# Patient Record
Sex: Female | Born: 1955
Health system: Southern US, Community
[De-identification: ages and names within clinical notes are randomized; demographics above are authoritative.]

---

## 2007-04-06 ENCOUNTER — Other Ambulatory Visit: Admission: RE | Admit: 2007-04-06 | Discharge: 2007-04-06 | Payer: Self-pay | Admitting: Family Medicine

## 2008-04-11 ENCOUNTER — Other Ambulatory Visit: Admission: RE | Admit: 2008-04-11 | Discharge: 2008-04-11 | Payer: Self-pay | Admitting: Family Medicine

## 2015-11-11 DIAGNOSIS — I10 Essential (primary) hypertension: Secondary | ICD-10-CM | POA: Diagnosis not present

## 2015-11-11 DIAGNOSIS — G43909 Migraine, unspecified, not intractable, without status migrainosus: Secondary | ICD-10-CM | POA: Diagnosis not present

## 2015-12-18 DIAGNOSIS — H1033 Unspecified acute conjunctivitis, bilateral: Secondary | ICD-10-CM | POA: Diagnosis not present

## 2016-01-06 DIAGNOSIS — H1033 Unspecified acute conjunctivitis, bilateral: Secondary | ICD-10-CM | POA: Diagnosis not present

## 2016-01-26 DIAGNOSIS — H1045 Other chronic allergic conjunctivitis: Secondary | ICD-10-CM | POA: Diagnosis not present

## 2016-05-03 DIAGNOSIS — D485 Neoplasm of uncertain behavior of skin: Secondary | ICD-10-CM | POA: Diagnosis not present

## 2016-05-03 DIAGNOSIS — L309 Dermatitis, unspecified: Secondary | ICD-10-CM | POA: Diagnosis not present

## 2016-05-03 DIAGNOSIS — Z23 Encounter for immunization: Secondary | ICD-10-CM | POA: Diagnosis not present

## 2016-05-03 DIAGNOSIS — L820 Inflamed seborrheic keratosis: Secondary | ICD-10-CM | POA: Diagnosis not present

## 2016-08-10 DIAGNOSIS — Z803 Family history of malignant neoplasm of breast: Secondary | ICD-10-CM | POA: Diagnosis not present

## 2016-08-10 DIAGNOSIS — Z1231 Encounter for screening mammogram for malignant neoplasm of breast: Secondary | ICD-10-CM | POA: Diagnosis not present

## 2016-11-01 DIAGNOSIS — Z01419 Encounter for gynecological examination (general) (routine) without abnormal findings: Secondary | ICD-10-CM | POA: Diagnosis not present

## 2016-11-01 DIAGNOSIS — Z6827 Body mass index (BMI) 27.0-27.9, adult: Secondary | ICD-10-CM | POA: Diagnosis not present

## 2016-11-01 DIAGNOSIS — Z1151 Encounter for screening for human papillomavirus (HPV): Secondary | ICD-10-CM | POA: Diagnosis not present

## 2016-11-16 DIAGNOSIS — I10 Essential (primary) hypertension: Secondary | ICD-10-CM | POA: Diagnosis not present

## 2016-11-16 DIAGNOSIS — G43909 Migraine, unspecified, not intractable, without status migrainosus: Secondary | ICD-10-CM | POA: Diagnosis not present

## 2016-11-16 DIAGNOSIS — G4700 Insomnia, unspecified: Secondary | ICD-10-CM | POA: Diagnosis not present

## 2017-02-01 DIAGNOSIS — H1033 Unspecified acute conjunctivitis, bilateral: Secondary | ICD-10-CM | POA: Diagnosis not present

## 2017-02-01 DIAGNOSIS — H53149 Visual discomfort, unspecified: Secondary | ICD-10-CM | POA: Diagnosis not present

## 2017-02-01 DIAGNOSIS — J302 Other seasonal allergic rhinitis: Secondary | ICD-10-CM | POA: Diagnosis not present

## 2017-02-01 DIAGNOSIS — H578 Other specified disorders of eye and adnexa: Secondary | ICD-10-CM | POA: Diagnosis not present

## 2017-02-01 DIAGNOSIS — H1013 Acute atopic conjunctivitis, bilateral: Secondary | ICD-10-CM | POA: Diagnosis not present

## 2017-02-08 DIAGNOSIS — H1033 Unspecified acute conjunctivitis, bilateral: Secondary | ICD-10-CM | POA: Diagnosis not present

## 2017-05-16 DIAGNOSIS — G4700 Insomnia, unspecified: Secondary | ICD-10-CM | POA: Diagnosis not present

## 2017-05-16 DIAGNOSIS — I10 Essential (primary) hypertension: Secondary | ICD-10-CM | POA: Diagnosis not present

## 2017-05-16 DIAGNOSIS — Z23 Encounter for immunization: Secondary | ICD-10-CM | POA: Diagnosis not present

## 2017-09-06 DIAGNOSIS — Z1231 Encounter for screening mammogram for malignant neoplasm of breast: Secondary | ICD-10-CM | POA: Diagnosis not present

## 2017-09-22 DIAGNOSIS — J0120 Acute ethmoidal sinusitis, unspecified: Secondary | ICD-10-CM | POA: Diagnosis not present

## 2017-11-14 DIAGNOSIS — G43909 Migraine, unspecified, not intractable, without status migrainosus: Secondary | ICD-10-CM | POA: Diagnosis not present

## 2017-11-14 DIAGNOSIS — R51 Headache: Secondary | ICD-10-CM | POA: Diagnosis not present

## 2017-11-14 DIAGNOSIS — Z13220 Encounter for screening for lipoid disorders: Secondary | ICD-10-CM | POA: Diagnosis not present

## 2017-11-14 DIAGNOSIS — G4700 Insomnia, unspecified: Secondary | ICD-10-CM | POA: Diagnosis not present

## 2017-11-14 DIAGNOSIS — I10 Essential (primary) hypertension: Secondary | ICD-10-CM | POA: Diagnosis not present

## 2017-11-15 ENCOUNTER — Other Ambulatory Visit (HOSPITAL_BASED_OUTPATIENT_CLINIC_OR_DEPARTMENT_OTHER): Payer: Self-pay | Admitting: Internal Medicine

## 2017-11-15 DIAGNOSIS — R519 Headache, unspecified: Secondary | ICD-10-CM

## 2017-11-15 DIAGNOSIS — R51 Headache: Principal | ICD-10-CM

## 2018-05-22 DIAGNOSIS — G43909 Migraine, unspecified, not intractable, without status migrainosus: Secondary | ICD-10-CM | POA: Diagnosis not present

## 2018-05-22 DIAGNOSIS — G4700 Insomnia, unspecified: Secondary | ICD-10-CM | POA: Diagnosis not present

## 2018-05-22 DIAGNOSIS — I10 Essential (primary) hypertension: Secondary | ICD-10-CM | POA: Diagnosis not present

## 2018-05-22 DIAGNOSIS — K30 Functional dyspepsia: Secondary | ICD-10-CM | POA: Diagnosis not present

## 2018-05-26 DIAGNOSIS — Z23 Encounter for immunization: Secondary | ICD-10-CM | POA: Diagnosis not present

## 2018-07-31 ENCOUNTER — Other Ambulatory Visit: Payer: Self-pay | Admitting: Gastroenterology

## 2018-07-31 DIAGNOSIS — R140 Abdominal distension (gaseous): Secondary | ICD-10-CM | POA: Diagnosis not present

## 2018-07-31 DIAGNOSIS — K5900 Constipation, unspecified: Secondary | ICD-10-CM | POA: Diagnosis not present

## 2018-07-31 DIAGNOSIS — R1084 Generalized abdominal pain: Secondary | ICD-10-CM | POA: Diagnosis not present

## 2018-07-31 DIAGNOSIS — K219 Gastro-esophageal reflux disease without esophagitis: Secondary | ICD-10-CM

## 2018-08-08 ENCOUNTER — Other Ambulatory Visit: Payer: BLUE CROSS/BLUE SHIELD

## 2018-08-11 ENCOUNTER — Other Ambulatory Visit: Payer: BLUE CROSS/BLUE SHIELD

## 2018-08-14 ENCOUNTER — Other Ambulatory Visit: Payer: BLUE CROSS/BLUE SHIELD

## 2018-08-21 DIAGNOSIS — K219 Gastro-esophageal reflux disease without esophagitis: Secondary | ICD-10-CM | POA: Diagnosis not present

## 2018-08-21 DIAGNOSIS — K2900 Acute gastritis without bleeding: Secondary | ICD-10-CM | POA: Diagnosis not present

## 2018-08-21 DIAGNOSIS — R12 Heartburn: Secondary | ICD-10-CM | POA: Diagnosis not present

## 2018-08-21 DIAGNOSIS — K2930 Chronic superficial gastritis without bleeding: Secondary | ICD-10-CM | POA: Diagnosis not present

## 2018-08-21 DIAGNOSIS — R1013 Epigastric pain: Secondary | ICD-10-CM | POA: Diagnosis not present

## 2018-08-24 DIAGNOSIS — K2930 Chronic superficial gastritis without bleeding: Secondary | ICD-10-CM | POA: Diagnosis not present

## 2018-09-19 DIAGNOSIS — J019 Acute sinusitis, unspecified: Secondary | ICD-10-CM | POA: Diagnosis not present

## 2018-10-02 DIAGNOSIS — Z1231 Encounter for screening mammogram for malignant neoplasm of breast: Secondary | ICD-10-CM | POA: Diagnosis not present

## 2018-10-02 DIAGNOSIS — Z803 Family history of malignant neoplasm of breast: Secondary | ICD-10-CM | POA: Diagnosis not present

## 2018-11-27 DIAGNOSIS — G43909 Migraine, unspecified, not intractable, without status migrainosus: Secondary | ICD-10-CM | POA: Diagnosis not present

## 2018-11-27 DIAGNOSIS — G4700 Insomnia, unspecified: Secondary | ICD-10-CM | POA: Diagnosis not present

## 2019-02-22 DIAGNOSIS — B373 Candidiasis of vulva and vagina: Secondary | ICD-10-CM | POA: Diagnosis not present

## 2019-02-22 DIAGNOSIS — R8290 Unspecified abnormal findings in urine: Secondary | ICD-10-CM | POA: Diagnosis not present

## 2019-02-22 DIAGNOSIS — N760 Acute vaginitis: Secondary | ICD-10-CM | POA: Diagnosis not present

## 2019-04-02 DIAGNOSIS — N949 Unspecified condition associated with female genital organs and menstrual cycle: Secondary | ICD-10-CM | POA: Diagnosis not present

## 2019-04-19 DIAGNOSIS — Z1159 Encounter for screening for other viral diseases: Secondary | ICD-10-CM | POA: Diagnosis not present

## 2019-04-23 DIAGNOSIS — Q438 Other specified congenital malformations of intestine: Secondary | ICD-10-CM | POA: Diagnosis not present

## 2019-04-23 DIAGNOSIS — K640 First degree hemorrhoids: Secondary | ICD-10-CM | POA: Diagnosis not present

## 2019-04-23 DIAGNOSIS — Z1211 Encounter for screening for malignant neoplasm of colon: Secondary | ICD-10-CM | POA: Diagnosis not present

## 2019-04-23 DIAGNOSIS — K5730 Diverticulosis of large intestine without perforation or abscess without bleeding: Secondary | ICD-10-CM | POA: Diagnosis not present

## 2019-04-26 ENCOUNTER — Other Ambulatory Visit (HOSPITAL_COMMUNITY): Payer: Self-pay | Admitting: Gastroenterology

## 2019-04-26 ENCOUNTER — Other Ambulatory Visit: Payer: Self-pay | Admitting: Gastroenterology

## 2019-04-26 DIAGNOSIS — R1084 Generalized abdominal pain: Secondary | ICD-10-CM

## 2019-05-07 ENCOUNTER — Ambulatory Visit (HOSPITAL_COMMUNITY)
Admission: RE | Admit: 2019-05-07 | Discharge: 2019-05-07 | Disposition: A | Discharge status: Home / Self Care | Payer: BC Managed Care – PPO | Source: Ambulatory Visit | Attending: Gastroenterology | Admitting: Gastroenterology

## 2019-05-07 ENCOUNTER — Other Ambulatory Visit: Payer: Self-pay

## 2019-05-07 DIAGNOSIS — R109 Unspecified abdominal pain: Secondary | ICD-10-CM | POA: Diagnosis not present

## 2019-05-07 DIAGNOSIS — R1084 Generalized abdominal pain: Secondary | ICD-10-CM

## 2019-05-07 MED ORDER — TECHNETIUM TC 99M SULFUR COLLOID: 2.1700 | Freq: Once | INTRAVENOUS | Status: DC | PRN

## 2019-05-07 MED ORDER — TECHNETIUM TC 99M SULFUR COLLOID
2.1700 | Freq: Once | INTRAVENOUS | Status: AC | PRN
  Administered 2019-05-07: 07:00:00 2.17 via ORAL

## 2019-05-22 DIAGNOSIS — Z01419 Encounter for gynecological examination (general) (routine) without abnormal findings: Secondary | ICD-10-CM | POA: Diagnosis not present

## 2019-05-22 DIAGNOSIS — Z124 Encounter for screening for malignant neoplasm of cervix: Secondary | ICD-10-CM | POA: Diagnosis not present

## 2019-06-06 ENCOUNTER — Other Ambulatory Visit: Payer: Self-pay | Admitting: Gastroenterology

## 2019-06-06 DIAGNOSIS — R1084 Generalized abdominal pain: Secondary | ICD-10-CM

## 2019-06-12 ENCOUNTER — Other Ambulatory Visit: Payer: BLUE CROSS/BLUE SHIELD

## 2019-06-12 DIAGNOSIS — Z0000 Encounter for general adult medical examination without abnormal findings: Secondary | ICD-10-CM | POA: Diagnosis not present

## 2019-06-12 DIAGNOSIS — I10 Essential (primary) hypertension: Secondary | ICD-10-CM | POA: Diagnosis not present

## 2019-06-12 DIAGNOSIS — L853 Xerosis cutis: Secondary | ICD-10-CM | POA: Diagnosis not present

## 2019-06-12 DIAGNOSIS — Z13220 Encounter for screening for lipoid disorders: Secondary | ICD-10-CM | POA: Diagnosis not present

## 2019-06-14 ENCOUNTER — Ambulatory Visit
Admission: RE | Admit: 2019-06-14 | Discharge: 2019-06-14 | Disposition: A | Discharge status: Home / Self Care | Payer: BC Managed Care – PPO | Source: Ambulatory Visit | Attending: Gastroenterology | Admitting: Gastroenterology

## 2019-06-14 DIAGNOSIS — R1013 Epigastric pain: Secondary | ICD-10-CM | POA: Diagnosis not present

## 2019-06-14 DIAGNOSIS — R1084 Generalized abdominal pain: Secondary | ICD-10-CM

## 2019-06-14 DIAGNOSIS — R109 Unspecified abdominal pain: Secondary | ICD-10-CM | POA: Diagnosis not present

## 2019-06-14 MED ORDER — IOPAMIDOL (ISOVUE-300) INJECTION 61%
100.0000 mL | Freq: Once | INTRAVENOUS | Status: AC | PRN
  Administered 2019-06-14: 100 mL via INTRAVENOUS

## 2019-11-06 DIAGNOSIS — Z1231 Encounter for screening mammogram for malignant neoplasm of breast: Secondary | ICD-10-CM | POA: Diagnosis not present

## 2019-12-10 DIAGNOSIS — G4700 Insomnia, unspecified: Secondary | ICD-10-CM | POA: Diagnosis not present

## 2019-12-10 DIAGNOSIS — G43909 Migraine, unspecified, not intractable, without status migrainosus: Secondary | ICD-10-CM | POA: Diagnosis not present

## 2020-03-13 DIAGNOSIS — J0140 Acute pansinusitis, unspecified: Secondary | ICD-10-CM | POA: Diagnosis not present

## 2020-06-09 DIAGNOSIS — Z01419 Encounter for gynecological examination (general) (routine) without abnormal findings: Secondary | ICD-10-CM | POA: Diagnosis not present

## 2020-06-12 DIAGNOSIS — B308 Other viral conjunctivitis: Secondary | ICD-10-CM | POA: Diagnosis not present

## 2020-06-20 DIAGNOSIS — Z13220 Encounter for screening for lipoid disorders: Secondary | ICD-10-CM | POA: Diagnosis not present

## 2020-06-20 DIAGNOSIS — Z0000 Encounter for general adult medical examination without abnormal findings: Secondary | ICD-10-CM | POA: Diagnosis not present

## 2020-06-20 DIAGNOSIS — Z131 Encounter for screening for diabetes mellitus: Secondary | ICD-10-CM | POA: Diagnosis not present

## 2020-06-20 DIAGNOSIS — Z23 Encounter for immunization: Secondary | ICD-10-CM | POA: Diagnosis not present

## 2020-06-23 DIAGNOSIS — B308 Other viral conjunctivitis: Secondary | ICD-10-CM | POA: Diagnosis not present

## 2020-06-23 DIAGNOSIS — H04201 Unspecified epiphora, right lacrimal gland: Secondary | ICD-10-CM | POA: Diagnosis not present

## 2020-08-12 DIAGNOSIS — L719 Rosacea, unspecified: Secondary | ICD-10-CM | POA: Diagnosis not present

## 2020-08-12 DIAGNOSIS — L570 Actinic keratosis: Secondary | ICD-10-CM | POA: Diagnosis not present

## 2020-08-12 DIAGNOSIS — L853 Xerosis cutis: Secondary | ICD-10-CM | POA: Diagnosis not present

## 2020-08-12 DIAGNOSIS — L309 Dermatitis, unspecified: Secondary | ICD-10-CM | POA: Diagnosis not present

## 2020-08-12 DIAGNOSIS — L578 Other skin changes due to chronic exposure to nonionizing radiation: Secondary | ICD-10-CM | POA: Diagnosis not present

## 2020-11-11 DIAGNOSIS — Z1231 Encounter for screening mammogram for malignant neoplasm of breast: Secondary | ICD-10-CM | POA: Diagnosis not present

## 2021-05-11 DIAGNOSIS — B379 Candidiasis, unspecified: Secondary | ICD-10-CM | POA: Diagnosis not present

## 2021-05-11 DIAGNOSIS — J0100 Acute maxillary sinusitis, unspecified: Secondary | ICD-10-CM | POA: Diagnosis not present

## 2021-05-11 DIAGNOSIS — T3695XA Adverse effect of unspecified systemic antibiotic, initial encounter: Secondary | ICD-10-CM | POA: Diagnosis not present

## 2021-06-15 DIAGNOSIS — Z01419 Encounter for gynecological examination (general) (routine) without abnormal findings: Secondary | ICD-10-CM | POA: Diagnosis not present

## 2021-06-22 DIAGNOSIS — G4700 Insomnia, unspecified: Secondary | ICD-10-CM | POA: Diagnosis not present

## 2021-06-22 DIAGNOSIS — K649 Unspecified hemorrhoids: Secondary | ICD-10-CM | POA: Diagnosis not present

## 2021-06-22 DIAGNOSIS — Z13220 Encounter for screening for lipoid disorders: Secondary | ICD-10-CM | POA: Diagnosis not present

## 2021-06-22 DIAGNOSIS — J321 Chronic frontal sinusitis: Secondary | ICD-10-CM | POA: Diagnosis not present

## 2021-06-22 DIAGNOSIS — Z131 Encounter for screening for diabetes mellitus: Secondary | ICD-10-CM | POA: Diagnosis not present

## 2021-06-22 DIAGNOSIS — Z0000 Encounter for general adult medical examination without abnormal findings: Secondary | ICD-10-CM | POA: Diagnosis not present

## 2021-10-19 ENCOUNTER — Other Ambulatory Visit: Payer: Self-pay | Admitting: Nurse Practitioner

## 2021-10-19 DIAGNOSIS — Z1231 Encounter for screening mammogram for malignant neoplasm of breast: Secondary | ICD-10-CM

## 2021-11-19 ENCOUNTER — Ambulatory Visit (INDEPENDENT_AMBULATORY_CARE_PROVIDER_SITE_OTHER): Payer: 59

## 2021-11-19 DIAGNOSIS — Z1231 Encounter for screening mammogram for malignant neoplasm of breast: Secondary | ICD-10-CM

## 2022-07-15 DIAGNOSIS — Z0000 Encounter for general adult medical examination without abnormal findings: Secondary | ICD-10-CM | POA: Diagnosis not present

## 2022-07-15 DIAGNOSIS — E7800 Pure hypercholesterolemia, unspecified: Secondary | ICD-10-CM | POA: Diagnosis not present

## 2022-10-21 ENCOUNTER — Other Ambulatory Visit: Payer: Self-pay | Admitting: Family Medicine

## 2022-10-21 DIAGNOSIS — Z1231 Encounter for screening mammogram for malignant neoplasm of breast: Secondary | ICD-10-CM

## 2022-10-25 DIAGNOSIS — J069 Acute upper respiratory infection, unspecified: Secondary | ICD-10-CM | POA: Diagnosis not present

## 2022-12-15 ENCOUNTER — Ambulatory Visit: Payer: Medicare Other

## 2022-12-15 DIAGNOSIS — Z1231 Encounter for screening mammogram for malignant neoplasm of breast: Secondary | ICD-10-CM | POA: Diagnosis not present

## 2023-01-18 ENCOUNTER — Other Ambulatory Visit: Payer: Self-pay | Admitting: Family Medicine

## 2023-01-18 DIAGNOSIS — Z13820 Encounter for screening for osteoporosis: Secondary | ICD-10-CM

## 2023-02-16 ENCOUNTER — Ambulatory Visit: Payer: Medicare Other

## 2023-02-16 DIAGNOSIS — Z13820 Encounter for screening for osteoporosis: Secondary | ICD-10-CM

## 2023-02-23 ENCOUNTER — Inpatient Hospital Stay: Payer: Medicare Other

## 2023-02-23 ENCOUNTER — Inpatient Hospital Stay: Payer: Medicare Other | Attending: Hematology and Oncology | Admitting: Hematology

## 2023-02-23 VITALS — BP 128/70 | HR 92 | Temp 98.1°F | Resp 18 | Wt 149.9 lb

## 2023-02-23 DIAGNOSIS — R233 Spontaneous ecchymoses: Secondary | ICD-10-CM | POA: Diagnosis present

## 2023-02-23 DIAGNOSIS — D699 Hemorrhagic condition, unspecified: Secondary | ICD-10-CM | POA: Diagnosis not present

## 2023-02-23 DIAGNOSIS — Z832 Family history of diseases of the blood and blood-forming organs and certain disorders involving the immune mechanism: Secondary | ICD-10-CM | POA: Diagnosis present

## 2023-02-23 LAB — CMP (CANCER CENTER ONLY)
ALT: 15 U/L (ref 0–44)
AST: 20 U/L (ref 15–41)
Albumin: 4.6 g/dL (ref 3.5–5.0)
Alkaline Phosphatase: 83 U/L (ref 38–126)
Anion gap: 7 (ref 5–15)
BUN: 16 mg/dL (ref 8–23)
CO2: 30 mmol/L (ref 22–32)
Calcium: 9.7 mg/dL (ref 8.9–10.3)
Chloride: 104 mmol/L (ref 98–111)
Creatinine: 0.98 mg/dL (ref 0.44–1.00)
GFR, Estimated: >60 mL/min (ref >60)
Glucose, Bld: 85 mg/dL (ref 70–99)
Potassium: 4 mmol/L (ref 3.5–5.1)
Sodium: 141 mmol/L (ref 135–145)
Total Bilirubin: 0.3 mg/dL (ref 0.3–1.2)
Total Protein: 7.8 g/dL (ref 6.5–8.1)

## 2023-02-23 LAB — CBC WITH DIFFERENTIAL (CANCER CENTER ONLY)
Abs Immature Granulocytes: 0.02 10*3/uL (ref 0.00–0.07)
Basophils Absolute: 0 10*3/uL (ref 0.0–0.1)
Basophils Relative: 1 %
Eosinophils Absolute: 0.1 10*3/uL (ref 0.0–0.5)
Eosinophils Relative: 2 %
HCT: 41.9 % (ref 36.0–46.0)
Hemoglobin: 14.1 g/dL (ref 12.0–15.0)
Immature Granulocytes: 0 %
Lymphocytes Relative: 25 %
Lymphs Abs: 2 10*3/uL (ref 0.7–4.0)
MCH: 32 pg (ref 26.0–34.0)
MCHC: 33.7 g/dL (ref 30.0–36.0)
MCV: 95.2 fL (ref 80.0–100.0)
Monocytes Absolute: 0.5 10*3/uL (ref 0.1–1.0)
Monocytes Relative: 6 %
Neutro Abs: 5.2 10*3/uL (ref 1.7–7.7)
Neutrophils Relative %: 66 %
Platelet Count: 317 10*3/uL (ref 150–400)
RBC: 4.4 MIL/uL (ref 3.87–5.11)
RDW: 13.2 % (ref 11.5–15.5)
WBC Count: 7.9 10*3/uL (ref 4.0–10.5)
nRBC: 0 % (ref 0.0–0.2)

## 2023-02-23 LAB — PLATELET FUNCTION ASSAY: Collagen / Epinephrine: 134 seconds (ref 0–193)

## 2023-02-23 LAB — PROTIME-INR
INR: 1 (ref 0.8–1.2)
Prothrombin Time: 13.2 seconds (ref 11.4–15.2)

## 2023-02-23 LAB — APTT: aPTT: 29 seconds (ref 24–36)

## 2023-02-23 LAB — FIBRINOGEN: Fibrinogen: 337 mg/dL (ref 210–475)

## 2023-02-23 NOTE — Progress Notes (Signed)
HEMATOLOGY/ONCOLOGY CONSULTATION NOTE  Date of Service: 02/23/2023  Patient Care Team: Roseanna Rainbow, PA-C (Inactive) as PCP - General (Physician Assistant)  CHIEF COMPLAINTS/PURPOSE OF CONSULTATION:  Evaluation due to family history of von Willebrand disease and significant bleeding issues  HISTORY OF PRESENTING ILLNESS:   Theresa Ward is a wonderful 67 y.o. female who has been referred to [place] by Tera Helper MD for evaluation due to family history of von Willebrand disease and significant bleeding issues.   She was seen by Dr. Bryon Lions on 01/17/2023 and reported that she tended to endorse significant bruising and bleeding.   Today, she reports that her son was diagnosed with Von Willebrand disease in his teens. At the time, she attributed this to her husband's side of the family. However, recent findings that her brother also endorses this disease has made her consider further evaluation.   She reports that her husband donated a lung lobe without any excessive bleeding issues.   She denies any chronic medical issues. Patient has endorsed migraines since childhood, generally 3x week. She has been following with a neurologist for 15 years and was previously on a medication for management. However, she gradually discontinued it as she was concerned that it may cause kidney problems. Patient was seen by a herbalist and has since been taking magnesium supplements which improved her migraines.   Patient reports that she had a normal delivery with her first pregnancy. However, patient endorsed heavy postpartum bleeding with her second child lasting for 10 weeks. She did receive tubal ligation after her second pregnancy. She has endorsed heavy bleeding with her periods throughout her life, and her menstrual bleeding had worsened after her pregnancy.  Patient was on birth control for 3 years prior to her first pregnancy. She was also on birth control between her first and second  pregnancy for 6 years.  Patient reports that her menstrual cycle initially discontinued at age 5, but she did endorse two periods at age 25. She was evaluated by gynecology and she has not had any menstrual cycles since.  Patient did receive tear duct surgery and did not have any issues with excessive bleeding. Patient denies having any previous dental extractions. She complains of severe bruising after a previous nasal procedure.  She denies any nose bleeds or gum bleeds.   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 Patient scraped her knee on one occasion, which bled for 10 minutes.  Patient complains of lower back pain while working as a Interior and spatial designer and regularly wears a back brace. She does take Ibuprofen and Tylenol for pain management. She has not taken any NSAID medications in the last couple of days. No recent infections or traumas at this time.  She reports that in her 10s she had a spot removed from her chest and endorsed keloid scarring. Patient did receive steroid injections to manage.   She is unsure whether her parents endorsed any bleeding issues. Patient reports that her son has been diagnosed with type 1 von Willebrand disease in his teens following excessive bleeding from wisdom tooth removal. Patient reports that he was  hospitalized for 3 days following a lung collapse.   She has one sister and two brothers. She reports that her brother did fall on  a on piece of metal and did have excessive bleeding with hemoglobin level measured at 6. Patient also notes that he endorsed stomach bleeding two months ago. She reports that her  sister did not endorse any excessive bleeding issues. Patient is unsure if her youngest brother has any bleeding issues. No other blood disorders in the family.  In regards to fhx, she reports a history of strokes and MI in her maternal and paternal side of the family. Her maternal aunt had breast  cancer. Her maternal uncles endorsed a MI in their 84s. Her mother did have high cholesterol and she did not drink alcohol or smoke.   She denies any abdominal pain, leg swelling, or spontaneous bleeding. Patient denies any planned upcoming surgeries. She denies any smoking or alcohol use.   MEDICAL HISTORY:  Migraine headaches Postpartum hemorrhage with 2nd childbirth Insomnia  SURGICAL HISTORY: none  SOCIAL HISTORY: Social History   Socioeconomic History   Marital status: Married    Spouse name: Not on file   Number of children: Not on file   Years of education: Not on file   Highest education level: Not on file  Occupational History   Not on file  Tobacco Use   Smoking status: Not on file   Smokeless tobacco: Not on file  Substance and Sexual Activity   Alcohol use: Not on file   Drug use: Not on file   Sexual activity: Not on file  Other Topics Concern   Not on file  Social History Narrative   Not on file   Social Determinants of Health   Financial Resource Strain: Not on file  Food Insecurity: Not on file  Transportation Needs: Not on file  Physical Activity: Not on file  Stress: Not on file  Social Connections: Not on file  Intimate Partner Violence: Not on file     FAMILY HISTORY: FHX of VWD  ALLERGIES:  has no allergies on file.  MEDICATIONS:  No current outpatient medications on file.   No current facility-administered medications for this visit.     REVIEW OF SYSTEMS:    10 Point review of Systems was done is negative except as noted above.  PHYSICAL EXAMINATION: ECOG PERFORMANCE STATUS: 1 - Symptomatic but completely ambulatory  . Vitals:   02/23/23 1439  BP: 128/70  Pulse: 92  Resp: 18  Temp: 98.1 F (36.7 C)  SpO2: 98%   Filed Weights   02/23/23 1439  Weight: 149 lb 14.4 oz (68 kg)   .There is no height or weight on file to calculate BMI.  GENERAL:alert, in no acute distress and comfortable SKIN: no acute rashes, no  significant lesions EYES: conjunctiva are pink and non-injected, sclera anicteric OROPHARYNX: MMM, no exudates, no oropharyngeal erythema or ulceration NECK: supple, no JVD LYMPH:  no palpable lymphadenopathy in the cervical, axillary or inguinal regions LUNGS: clear to auscultation b/l with normal respiratory effort HEART: regular rate & rhythm ABDOMEN:  normoactive bowel sounds , non tender, not distended. Extremity: no pedal edema PSYCH: alert & oriented x 3 with fluent speech NEURO: no focal motor/sensory deficits  LABORATORY DATA:  I have reviewed the data as listed  .     No data to display          .     No data to display           RADIOGRAPHIC STUDIES: I have personally reviewed the radiological images as listed and agreed with the findings in the report. DG BONE DENSITY (DXA)  Result Date: 02/16/2023 EXAM: DUAL X-RAY ABSORPTIOMETRY (DXA) FOR BONE MINERAL DENSITY IMPRESSION: Gigi Gin BREEDLOVE Your patient Iyanna Bakare completed a BMD test on 02/16/2023 using  the Lunar IDXA DXA System (analysis version: 16.SP2) manufactured by [employer]. The following summarizes the results of our evaluation. [facility] PATIENT: Name: Nahlia, Cartwright Patient ID: [id number] Birth Date: [birth year] Height: 62.7 in. Gender: Female Measured: 02/16/2023 Weight: 148.2 lbs. Indications: Caucasian, Estrogen Deficiency, Height Loss, Postmenopausal Fractures: Treatments: ASSESSMENT: The BMD measured at Femur Neck Right is 0.887 g/cm2 with a T-score of -1.1. This patient is considered osteopenic according to World Health Organization [facility]) criteria. The scan quality is good. Site Region Measured Date Measured Age WHO YA BMD Classification T-score AP Spine L1-L4 02/16/2023 65.1 Normal 0.1 1.189 g/cm2 DualFemur Neck Right 02/16/2023 65.1 years Low Bone Mass -1.1 0.887 g/cm2 World Health Organization [facility]) criteria for post-menopausal, Caucasian Women: Normal        T-score at or above -1 SD Low Bone Mass  T-score between -1 and -2.5 SD Osteoporosis  T-score at or below -2.5 SD RECOMMENDATION: 1. All patients should optimize calcium and vitamin D intake. 2. Consider FDA-approved medical therapies in postmenopausal women and men age 80 years and older, based on the following: a. A hip or vertebral(clinical or morphometric) fracture b. T-score < -2.5 at the femoral neck or spine after appropriate evaluation to exclude secondary causes c. Low bone mass (T-score between -1.0 and -2.5 at the femoral neck or spine) and a 10-year probability of a hip fracture > 3% or a 10-year probability of a major osteoporosis-related fracture > 20% based on the [place]-adapted WHO algorithm d. Clinician judgement and/or patient preferences may indicate treatment for people with 10-year fracture probabilities above or below these levels FOLLOW-UP: Patients with diagnosis of osteoporosis or at high risk for fracture should have regular bone mineral density tests. For patients eligible for Medicare, routine testing is allowed once every 2 years. The testing frequency can be increased to one year for patients who have rapidly progressing disease, those who are receiving or discontinuing medical therapy to restore bone mass, or have additional risk factors. I have reviewed this report, and agree with the above findings  Radiology Patient: Lawerance Bach   Referring Physician: Sheliah Hatch Birth Date: [birth year] [age] Patient ID: [id number] Height: 62.7 in. Weight: 148.2 lbs. Measured: 02/16/2023 2:58:25 PM (16 SP 2) Gender: Female Ethnicity: White Analyzed: 02/16/2023 2:58:39 PM (16 SP 2) FRAX* 10-year Probability of Fracture Based on femoral neck BMD: DualFemur (Right) Major Osteoporotic Fracture: 8.1% Hip Fracture:                0.6% Population:                  [place] (Caucasian) Risk Factors:                None *FRAX is a Armed forces logistics/support/administrative officer of the [employer] for Metabolic Bone Disease, a  World Science writer (WHO) [employer]. ASSESSMENT: The probability of a major osteoporotic fracture is 8.1% within the next ten years. The probability of a hip fracture is 0.6% within the next ten years. Electronically Signed   By: Frederico Hamman M.D.   On: 02/16/2023 15:16    ASSESSMENT & PLAN:  67 y.o. female with:  Family history of von Willebrand disease and significant bleeding issues ( heavy menstrual bleeding and significant postpartum hemorrhage with 2nd pregnancy)  PLAN:  -informed patient that Von Willebrand disease may be passed on from either side of the family -educated patient that Ibuprofen/NSAIDS would be a contraindication if patient  does turn out Von Willebrand disease -informed patient that birth control may have previously provided protection from bleeding issues realated to VWD. -educated patient on details of type 1, type 2, and type 3 von willebrand disease -patient has no random spontaneous bleeding, but rather generally endorses labor-related bleeding -will order lab work to evaluate anticoagulation pathways and platelet function -if patient does show evidence of  Von Willebrand disease, it would likely be mild which may require some attention with any potential surgical procedures with factor replacement -continue to stay regularly active -answered all of patient's questions in detail  . Orders Placed This Encounter  Procedures   CBC with Differential (Cancer Center Only)    Standing Status:   Future    Number of Occurrences:   1    Standing Expiration Date:   02/23/2024   CMP (Cancer Center only)    Standing Status:   Future    Number of Occurrences:   1    Standing Expiration Date:   02/23/2024   Platelet function assay    Standing Status:   Future    Number of Occurrences:   1    Standing Expiration Date:   02/23/2024   Von Willebrand panel    Standing Status:   Future    Number of Occurrences:   1    Standing Expiration Date:    02/23/2024   Von Willebrand Multimeric    Standing Status:   Future    Number of Occurrences:   1    Standing Expiration Date:   02/23/2024   Protime-INR    Standing Status:   Future    Number of Occurrences:   1    Standing Expiration Date:   02/23/2024   APTT    Standing Status:   Future    Number of Occurrences:   1    Standing Expiration Date:   02/23/2024   Fibrinogen    Standing Status:   Future    Number of Occurrences:   1    Standing Expiration Date:   02/23/2024    FOLLOW-UP: Labs today Phone visit with Dr Candise Che in about 3 weeks  The total time spent in the appointment was 60 minutes* .  All of the patient's questions were answered with apparent satisfaction. The patient knows to call the clinic with any problems, questions or concerns.   Wyvonnia Lora MD MS AAHIVMS [facility] Hematology/Oncology Physician [facility]  .*Total Encounter Time as defined by the Centers for Medicare and Medicaid Services includes, in addition to the face-to-face time of a patient visit (documented in the note above) non-face-to-face time: obtaining and reviewing outside history, ordering and reviewing medications, tests or procedures, care coordination (communications with other health care professionals or caregivers) and documentation in the medical record.    I,Mitra Faeizi,acting as a Neurosurgeon for Wyvonnia Lora, MD.,have documented all relevant documentation on the behalf of Wyvonnia Lora, MD,as directed by  Wyvonnia Lora, MD while in the presence of Wyvonnia Lora, MD.  .I have reviewed the above documentation for accuracy and completeness, and I agree with the above. Johney Maine MD

## 2023-02-24 ENCOUNTER — Encounter: Payer: Medicare Other | Admitting: Hematology and Oncology

## 2023-02-24 ENCOUNTER — Other Ambulatory Visit: Payer: Medicare Other

## 2023-02-24 LAB — VON WILLEBRAND PANEL
Coagulation Factor VIII: 95 % (ref 56–140)
Ristocetin Co-factor, Plasma: 72 % (ref 50–200)
Von Willebrand Antigen, Plasma: 100 % (ref 50–200)

## 2023-02-24 LAB — COAG STUDIES INTERP REPORT

## 2023-03-03 LAB — VON WILLEBRAND FACTOR MULTIMER

## 2023-03-17 ENCOUNTER — Inpatient Hospital Stay: Payer: Medicare Other | Attending: Hematology and Oncology | Admitting: Hematology

## 2023-03-17 DIAGNOSIS — R58 Hemorrhage, not elsewhere classified: Secondary | ICD-10-CM | POA: Diagnosis not present

## 2023-03-17 DIAGNOSIS — Z832 Family history of diseases of the blood and blood-forming organs and certain disorders involving the immune mechanism: Secondary | ICD-10-CM | POA: Diagnosis not present

## 2023-03-17 NOTE — Progress Notes (Signed)
HEMATOLOGY/ONCOLOGY PHONE VISIT NOTE  Date of Service: 03/17/2023  Patient Care Team: Roseanna Rainbow, PA-C (Inactive) as PCP - General (Physician Assistant)  CHIEF COMPLAINTS/PURPOSE OF CONSULTATION:  Evaluation due to family history of von Willebrand disease and significant bleeding issues  HISTORY OF PRESENTING ILLNESS:   Theresa Ward is a wonderful 67 y.o. female who has been referred to [place] by Tera Helper MD for evaluation due to family history of von Willebrand disease and significant bleeding issues.   She was seen by Dr. Bryon Lions on 01/17/2023 and reported that she tended to endorse significant bruising and bleeding.   Today, she reports that her son was diagnosed with Von Willebrand disease in his teens. At the time, she attributed this to her husband's side of the family. However, recent findings that her brother also endorses this disease has made her consider further evaluation.   She reports that her husband donated a lung lobe without any excessive bleeding issues.   She denies any chronic medical issues. Patient has endorsed migraines since childhood, generally 3x week. She has been following with a neurologist for 15 years and was previously on a medication for management. However, she gradually discontinued it as she was concerned that it may cause kidney problems. Patient was seen by a herbalist and has since been taking magnesium supplements which improved her migraines.   Patient reports that she had a normal delivery with her first pregnancy. However, patient endorsed heavy postpartum bleeding with her second child lasting for 10 weeks. She did receive tubal ligation after her second pregnancy. She has endorsed heavy bleeding with her periods throughout her life, and her menstrual bleeding had worsened after her pregnancy.  Patient was on birth control for 3 years prior to her first pregnancy. She was also on birth control between her first and second  pregnancy for 6 years.  Patient reports that her menstrual cycle initially discontinued at age 59, but she did endorse two periods at age 63. She was evaluated by gynecology and she has not had any menstrual cycles since.  Patient did receive tear duct surgery and did not have any issues with excessive bleeding. Patient denies having any previous dental extractions. She complains of severe bruising after a previous nasal procedure.  She denies any nose bleeds or gum bleeds.   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 Patient scraped her knee on one occasion, which bled for 10 minutes.  Patient complains of lower back pain while working as a Interior and spatial designer and regularly wears a back brace. She does take Ibuprofen and Tylenol for pain management. She has not taken any NSAID medications in the last couple of days. No recent infections or traumas at this time.  She reports that in her 31s she had a spot removed from her chest and endorsed keloid scarring. Patient did receive steroid injections to manage.   She is unsure whether her parents endorsed any bleeding issues. Patient reports that her son has been diagnosed with type 1 von Willebrand disease in his teens following excessive bleeding from wisdom tooth removal. Patient reports that he was  hospitalized for 3 days following a lung collapse.   She has one sister and two brothers. She reports that her brother did fall on  a on piece of metal and did have excessive bleeding with hemoglobin level measured at 6. Patient also notes that he endorsed stomach bleeding two months ago. She reports that  her sister did not endorse any excessive bleeding issues. Patient is unsure if her youngest brother has any bleeding issues. No other blood disorders in the family.  In regards to fhx, she reports a history of strokes and MI in her maternal and paternal side of the family. Her maternal aunt had breast  cancer. Her maternal uncles endorsed a MI in their 70s. Her mother did have high cholesterol and she did not drink alcohol or smoke.   She denies any abdominal pain, leg swelling, or spontaneous bleeding. Patient denies any planned upcoming surgeries. She denies any smoking or alcohol use.   INTERVAL HISTORY:  [name] is a 67 y.o. female here for continued evaluation and management due to family history of von Willebrand disease and significant bleeding issues.   Patient was initially seen by me on 02/23/2023 and reported endorsing migraines since childhood, generally 3x a week. She reported scraping her knee on one occasion which caused bleeding for 10 minutes. Patient also complained of lower back pain.  I connected with Lawerance Bach on 03/17/23 at  3:30 PM EDT by telephone visit and verified that I am speaking with the correct person using two identifiers.   I discussed the limitations, risks, security and privacy concerns of performing an evaluation and management service by telemedicine and the availability of in-person appointments. I also discussed with the patient that there may be a patient responsible charge related to this service. The patient expressed understanding and agreed to proceed.   Other persons participating in the visit and their role in the encounter: none   Patient's location: home  Provider's location: [facility]   Chief Complaint: continued evaluation and management due to family history of von Willebrand disease and significant bleeding issues  Today, she reports that she has been feeling well overall since her last clinical visit. The results of her recent lab testing, including von Willebrand's testing and coagulation workup was discussed with her in detail.  Patient reports that her husband did undergo major surgeries including hernia surgery and partial donation of his lung, during which there was no concern of excessive bleeding.  MEDICAL HISTORY:   Migraine headaches Postpartum hemorrhage with 2nd childbirth Insomnia  SURGICAL HISTORY: none  SOCIAL HISTORY: Social History   Socioeconomic History   Marital status: Married    Spouse name: Not on file   Number of children: Not on file   Years of education: Not on file   Highest education level: Not on file  Occupational History   Not on file  Tobacco Use   Smoking status: Not on file   Smokeless tobacco: Not on file  Substance and Sexual Activity   Alcohol use: Not on file   Drug use: Not on file   Sexual activity: Not on file  Other Topics Concern   Not on file  Social History Narrative   Not on file   Social Determinants of Health   Financial Resource Strain: Not on file  Food Insecurity: Not on file  Transportation Needs: Not on file  Physical Activity: Not on file  Stress: Not on file  Social Connections: Not on file  Intimate Partner Violence: Not on file     FAMILY HISTORY: FHX of VWD  ALLERGIES:  is allergic to penicillins, prednisone, and sulfa antibiotics.  MEDICATIONS:  Current Outpatient Medications  Medication Sig Dispense Refill   fexofenadine (ALLEGRA ALLERGY) 180 MG tablet Take 180 mg by mouth at bedtime.     hydrocortisone (PROCTOSOL HC) 2.5 %  rectal cream Place 1 Application rectally as needed.     Magnesium 200 MG TABS Take 200 mg by mouth daily.     temazepam (RESTORIL) 30 MG capsule Take 30 mg by mouth at bedtime.     No current facility-administered medications for this visit.     REVIEW OF SYSTEMS:    10 Point review of Systems was done is negative except as noted above.   PHYSICAL EXAMINATION: TELEMEDICINE VISIT ECOG PERFORMANCE STATUS: 1 - Symptomatic but completely ambulatory   LABORATORY DATA:  I have reviewed the data as listed  .    Latest Ref Rng & Units 02/23/2023    3:28 PM  CBC  WBC 4.0 - 10.5 K/uL 7.9   Hemoglobin 12.0 - 15.0 g/dL 11.9   Hematocrit 14.7 - 46.0 % 41.9   Platelets 150 - 400 K/uL 317      .    Latest Ref Rng & Units 02/23/2023    3:28 PM  CMP  Glucose 70 - 99 mg/dL 85   BUN 8 - 23 mg/dL 16   Creatinine 8.29 - 1.00 mg/dL 5.62   Sodium 130 - 865 mmol/L 141   Potassium 3.5 - 5.1 mmol/L 4.0   Chloride 98 - 111 mmol/L 104   CO2 22 - 32 mmol/L 30   Calcium 8.9 - 10.3 mg/dL 9.7   Total Protein 6.5 - 8.1 g/dL 7.8   Total Bilirubin 0.3 - 1.2 mg/dL 0.3   Alkaline Phos 38 - 126 U/L 83   AST 15 - 41 U/L 20   ALT 0 - 44 U/L 15      RADIOGRAPHIC STUDIES: I have personally reviewed the radiological images as listed and agreed with the findings in the report. DG BONE DENSITY (DXA)  Result Date: 02/16/2023 EXAM: DUAL X-RAY ABSORPTIOMETRY (DXA) FOR BONE MINERAL DENSITY IMPRESSION: Gigi Gin BREEDLOVE Your patient Wrigley Reisig completed a BMD test on 02/16/2023 using the Lunar IDXA DXA System (analysis version: 16.SP2) manufactured by [employer]. The following summarizes the results of our evaluation. [facility] PATIENT: Name: Chrystie, Dimos Patient ID: [id number] Birth Date: 12/12/1955 Height: 62.7 in. Gender: Female Measured: 02/16/2023 Weight: 148.2 lbs. Indications: Caucasian, Estrogen Deficiency, Height Loss, Postmenopausal Fractures: Treatments: ASSESSMENT: The BMD measured at Femur Neck Right is 0.887 g/cm2 with a T-score of -1.1. This patient is considered osteopenic according to World Health Organization [facility]) criteria. The scan quality is good. Site Region Measured Date Measured Age WHO YA BMD Classification T-score AP Spine L1-L4 02/16/2023 65.1 Normal 0.1 1.189 g/cm2 DualFemur Neck Right 4/16/2023 65.1 years Low Bone Mass -1.1 0.887 g/cm2 World Health Organization [facility]) criteria for post-menopausal, Caucasian Women: Normal        T-score at or above -1 SD Low Bone Mass T-score between -1 and -2.5 SD Osteoporosis  T-score at or below -2.5 SD RECOMMENDATION: 1. All patients should optimize calcium and vitamin D intake. 2. Consider FDA-approved medical therapies in postmenopausal  women and men age 67 years and older, based on the following: a. A hip or vertebral(clinical or morphometric) fracture b. T-score < -2.5 at the femoral neck or spine after appropriate evaluation to exclude secondary causes c. Low bone mass (T-score between -1.0 and -2.5 at the femoral neck or spine) and a 10-year probability of a hip fracture > 3% or a 10-year probability of a major osteoporosis-related fracture > 20% based on the [place]-adapted WHO algorithm d. Clinician judgement and/or patient preferences may indicate treatment for people with 10-year fracture probabilities  above or below these levels FOLLOW-UP: Patients with diagnosis of osteoporosis or at high risk for fracture should have regular bone mineral density tests. For patients eligible for Medicare, routine testing is allowed once every 2 years. The testing frequency can be increased to one year for patients who have rapidly progressing disease, those who are receiving or discontinuing medical therapy to restore bone mass, or have additional risk factors. I have reviewed this report, and agree with the above findings [facility] Patient: Lawerance Bach   Referring Physician: Sheliah Hatch Birth Date: 1955/09/12 [age] Patient ID: [id number] Height: 62.7 in. Weight: 148.2 lbs. Measured: 02/16/2023 2:58:25 PM (16 SP 2) Gender: Female Ethnicity: White Analyzed: 02/16/2023 2:58:39 PM (16 SP 2) FRAX* 10-year Probability of Fracture Based on femoral neck BMD: DualFemur (Right) Major Osteoporotic Fracture: 8.1% Hip Fracture:                0.6% Population:                  [place] (Caucasian) Risk Factors:                None *FRAX is a Armed forces logistics/support/administrative officer of the [employer] for Metabolic Bone Disease, a World Science writer (WHO) [employer]. ASSESSMENT: The probability of a major osteoporotic fracture is 8.1% within the next ten years. The probability of a hip fracture is 0.6% within the next ten  years. Electronically Signed   By: Frederico Hamman M.D.   On: 02/16/2023 15:16    ASSESSMENT & PLAN:   67 y.o. female with:  Family history of von Willebrand disease and significant bleeding issues ( heavy menstrual bleeding and significant postpartum hemorrhage with 2nd pregnancy)  PLAN:  -Discussed lab results from 02/23/2023 in detail with patient. CBC normal, showed WBC of 7.9K, hemoglobin of 14.1, and platelets of 317K. -CMP normal with no signs of kidney or liver disease -Platelet function assay normal -Von willebrand factor levels normal -Von Willebrand Antigen (protein) 100% and Ristocetin Co-factor (activity) 72%, both of which are normal -Factor VIII levels normal at 95% -Coagulation labs normal, fibrinogen normal -blood testing does not show findings consistent with von Willebrand disease or any other obvious primary bleeding disorder -Von Willebrand Multimers analysis normal -Prothrombin time level 13.2 and INR 1.0 -Findings do not suggest presence of any other obvious bleeding disorder -patient has no obvious confounding factors and is not on any hormones that would artificially elevate levels -discussed that patient's husband may have a mild version of Von Willebrand disease or her son may have developed the disease through a sporadic mutation and may have been the first to develop the mutation in the lineage.  -answered all of patient's questions in detail  FOLLOW-UP: RTC with PCP  The total time spent in the appointment was 21 minutes* .  All of the patient's questions were answered with apparent satisfaction. The patient knows to call the clinic with any problems, questions or concerns.   Wyvonnia Lora MD MS AAHIVMS [facility] Hematology/Oncology Physician [facility]  .*Total Encounter Time as defined by the Centers for Medicare and Medicaid Services includes, in addition to the face-to-face time of a patient visit (documented in the note above)  non-face-to-face time: obtaining and reviewing outside history, ordering and reviewing medications, tests or procedures, care coordination (communications with other health care professionals or caregivers) and documentation in the medical record.    I,Mitra Faeizi,acting  as a Neurosurgeon for Wyvonnia Lora, MD.,have documented all relevant documentation on the behalf of Wyvonnia Lora, MD,as directed by  Wyvonnia Lora, MD while in the presence of Wyvonnia Lora, MD.  .I have reviewed the above documentation for accuracy and completeness, and I agree with the above. Johney Maine MD

## 2023-05-31 IMAGING — MG MM DIGITAL SCREENING BILAT W/ TOMO AND CAD
6 of 10 series · 6 of 30 positions shown · non-contrast
Comparison: None.

ACR Breast Density Category a: The breast tissue is almost entirely
fatty.

CLINICAL DATA: Screening.

EXAM:
DIGITAL SCREENING BILATERAL MAMMOGRAM WITH TOMOSYNTHESIS AND CAD
TECHNIQUE: Bilateral screening digital craniocaudal and mediolateral oblique
mammograms were obtained. Bilateral screening digital breast
tomosynthesis was performed. The images were evaluated with
computer-aided detection.

[L CC synth-2D]
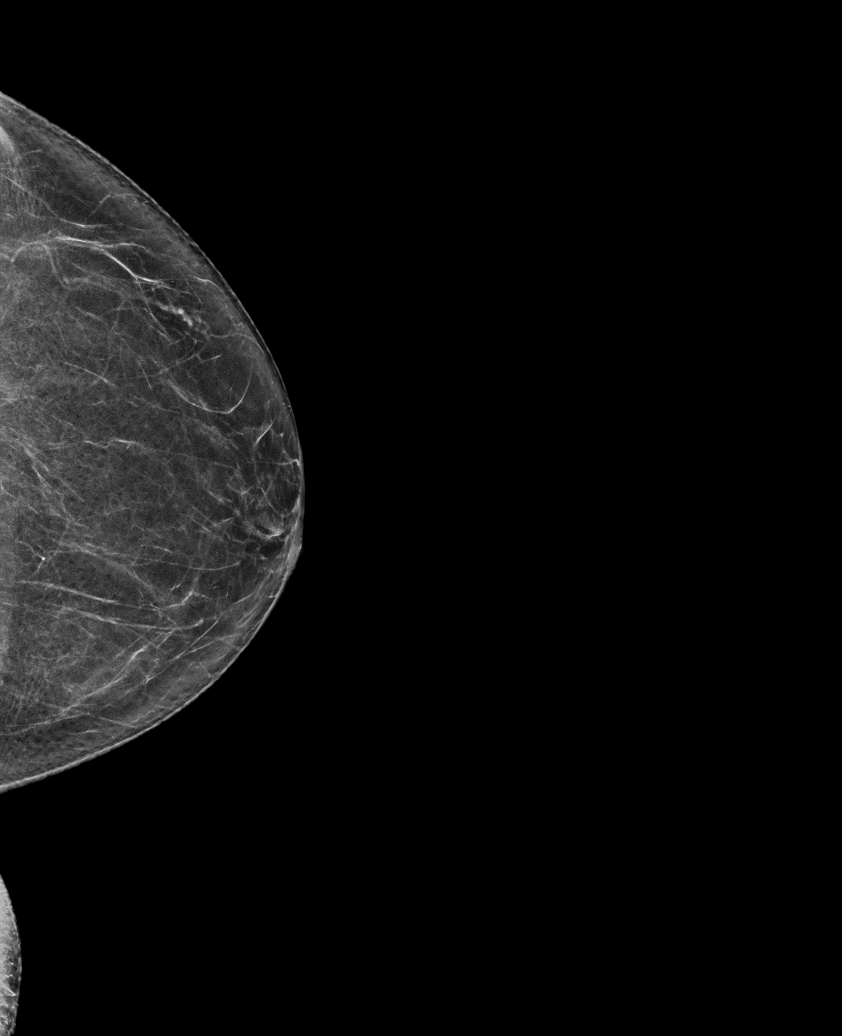

[R CC synth-2D]
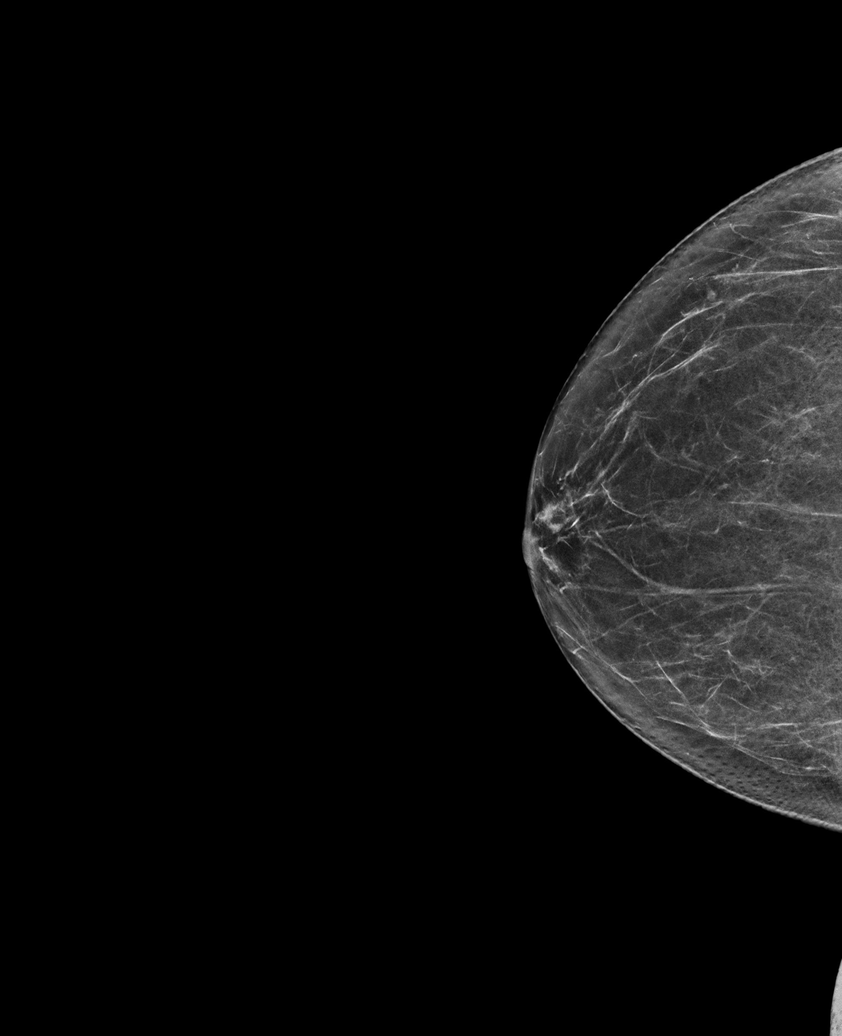

[L MLO synth-2D (1 of 2)]
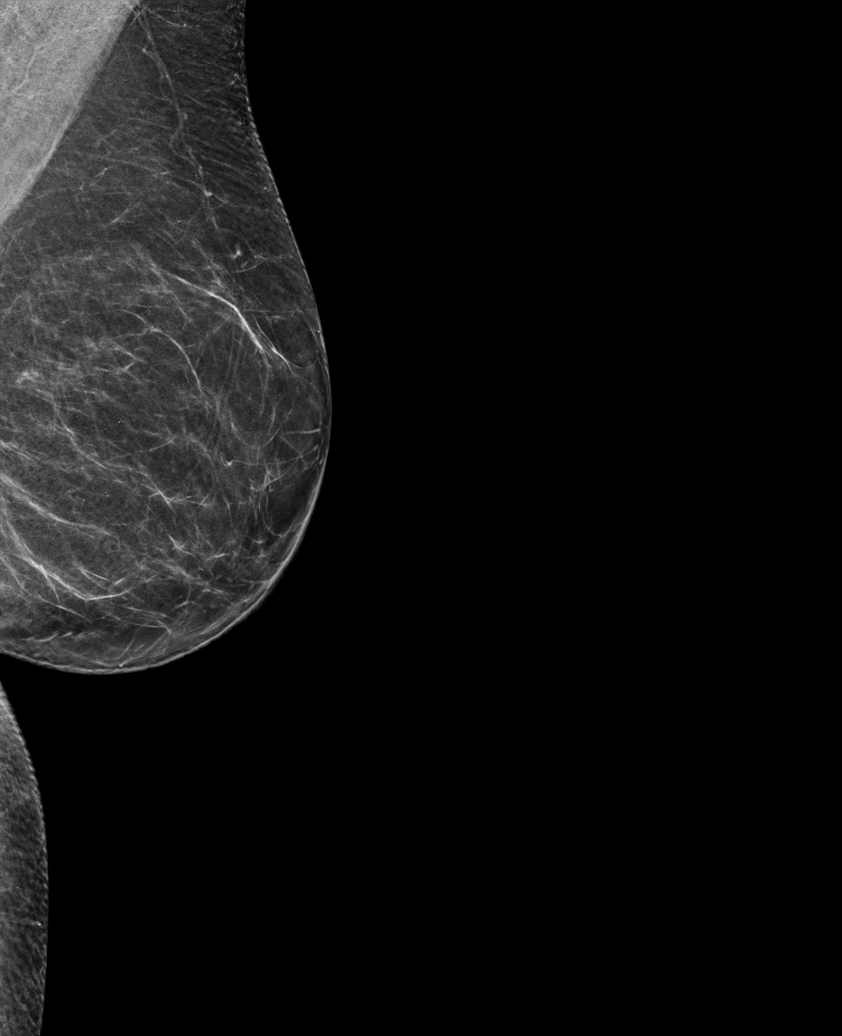

[R MLO synth-2D]
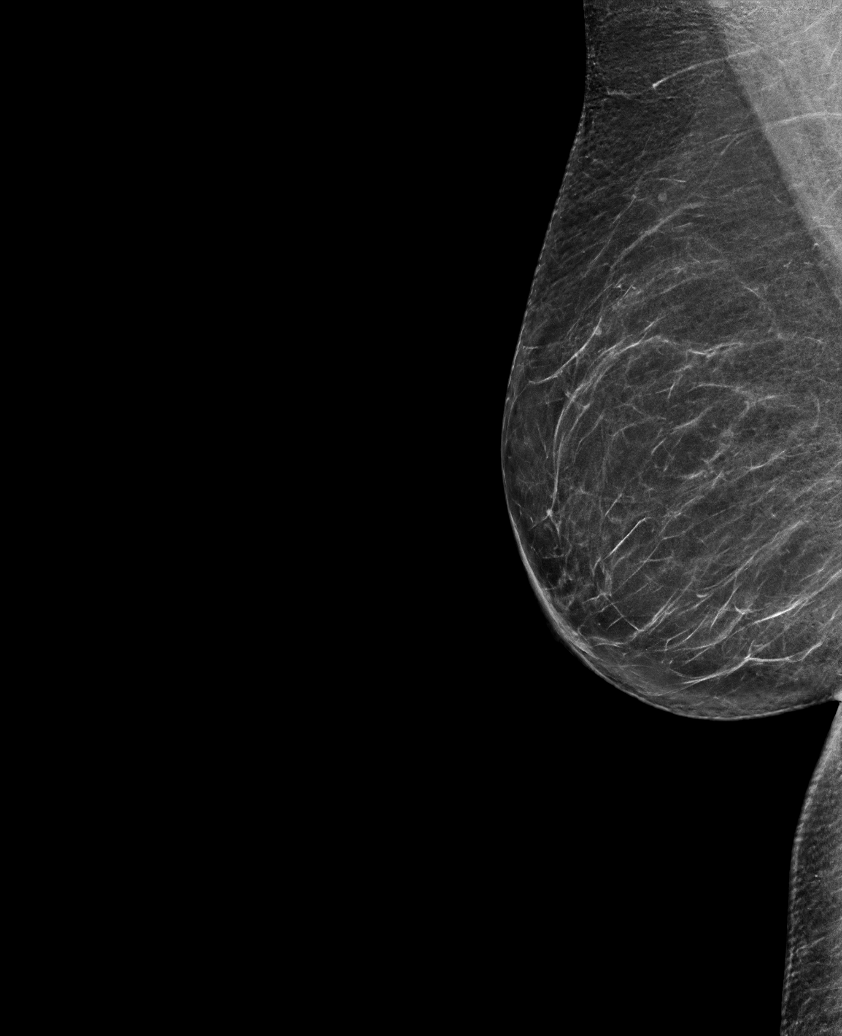

[L MLO synth-2D (2 of 2)]
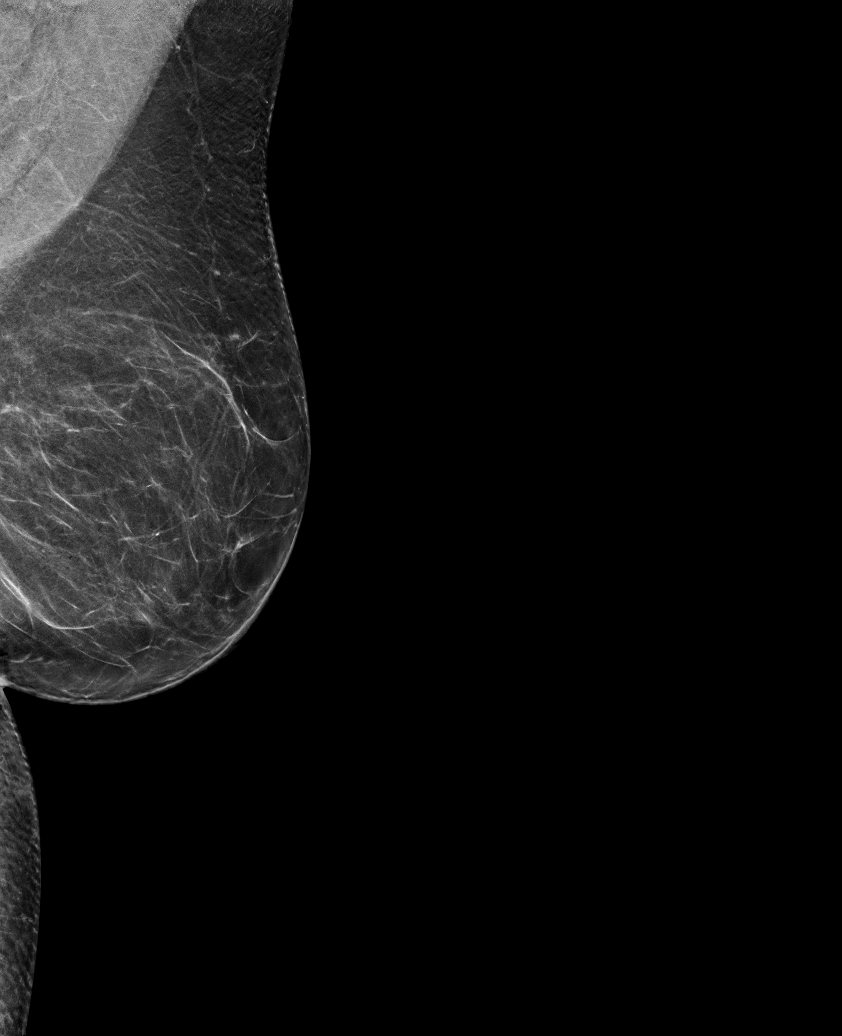

[R MLO tomo · tomo slice 35/68.0]
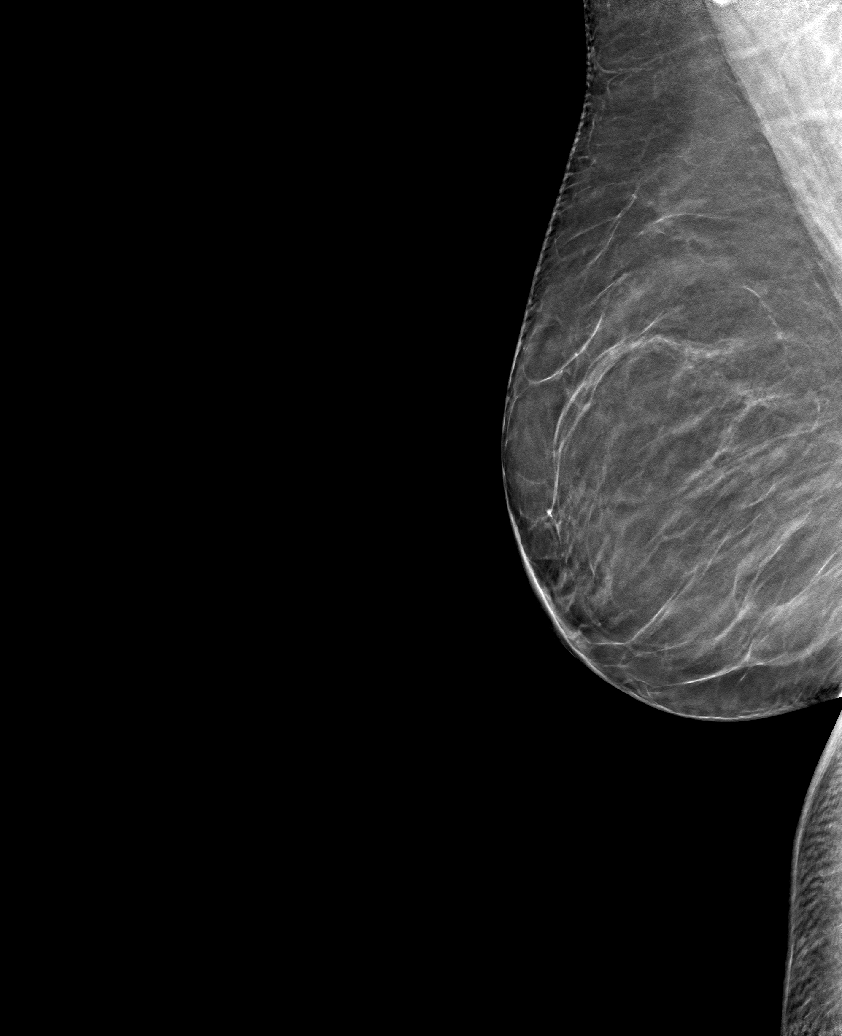

[6 of 30 positions shown; findings below may reference images not displayed]

FINDINGS: There are no findings suspicious for malignancy.
IMPRESSION: No mammographic evidence of malignancy. A result letter of this
screening mammogram will be mailed directly to the patient.

RECOMMENDATION:
Screening mammogram in one year. (Code:44-M-M6Q)

BI-RADS CATEGORY  1: Negative.

## 2023-11-01 ENCOUNTER — Other Ambulatory Visit: Payer: Self-pay | Admitting: Family Medicine

## 2023-11-01 DIAGNOSIS — Z1231 Encounter for screening mammogram for malignant neoplasm of breast: Secondary | ICD-10-CM

## 2023-12-14 ENCOUNTER — Encounter (HOSPITAL_COMMUNITY): Payer: Self-pay

## 2023-12-21 ENCOUNTER — Ambulatory Visit

## 2023-12-21 ENCOUNTER — Other Ambulatory Visit: Payer: Self-pay | Admitting: Family Medicine

## 2023-12-21 DIAGNOSIS — Z1231 Encounter for screening mammogram for malignant neoplasm of breast: Secondary | ICD-10-CM
